# Patient Record
Sex: Female | Born: 1975 | Race: Black or African American | Hispanic: No | Marital: Single | Smoking: Never smoker
Health system: Southern US, Community
[De-identification: ages and names within clinical notes are randomized; demographics above are authoritative.]

## PROBLEM LIST (undated history)

## (undated) HISTORY — PX: HERNIA REPAIR: SHX51

---

## 2012-08-27 ENCOUNTER — Emergency Department: Payer: Self-pay | Admitting: Emergency Medicine

## 2012-08-27 LAB — CBC
HCT: 23.2 % — ABNORMAL LOW (ref 35.0–47.0)
MCH: 14.9 pg — ABNORMAL LOW (ref 26.0–34.0)
Platelet: 157 10*3/uL (ref 150–440)
RBC: 4.27 10*6/uL (ref 3.80–5.20)
RDW: 23.5 % — ABNORMAL HIGH (ref 11.5–14.5)

## 2012-08-27 LAB — COMPREHENSIVE METABOLIC PANEL
Albumin: 3.8 g/dL (ref 3.4–5.0)
Anion Gap: 7 (ref 7–16)
BUN: 13 mg/dL (ref 7–18)
Bilirubin,Total: 0.3 mg/dL (ref 0.2–1.0)
Calcium, Total: 8.8 mg/dL (ref 8.5–10.1)
Chloride: 110 mmol/L — ABNORMAL HIGH (ref 98–107)
Co2: 23 mmol/L (ref 21–32)
Potassium: 3.7 mmol/L (ref 3.5–5.1)
Sodium: 140 mmol/L (ref 136–145)
Total Protein: 8 g/dL (ref 6.4–8.2)

## 2013-09-11 ENCOUNTER — Observation Stay: Payer: Self-pay

## 2014-04-07 ENCOUNTER — Emergency Department: Payer: Self-pay | Admitting: Emergency Medicine

## 2014-06-19 ENCOUNTER — Emergency Department: Payer: Self-pay | Admitting: Emergency Medicine

## 2014-06-22 LAB — BETA STREP CULTURE(ARMC)

## 2014-07-11 ENCOUNTER — Emergency Department: Payer: Self-pay | Admitting: Emergency Medicine
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
## 2019-08-18 ENCOUNTER — Ambulatory Visit: Payer: Self-pay | Attending: Internal Medicine

## 2019-08-18 DIAGNOSIS — Z23 Encounter for immunization: Secondary | ICD-10-CM

## 2019-08-18 NOTE — Progress Notes (Signed)
   Covid-19 Vaccination Clinic  Name:  SUSANNAH CARBIN    MRN: [id number] DOB: [birth year]  08/18/2019  Ms. Glick was observed post Covid-19 immunization for 15 minutes without incident. She was provided with Vaccine Information Sheet and instruction to access the V-Safe system.   Ms. Littleton was instructed to call 911 with any severe reactions post vaccine: Marland Kitchen Difficulty breathing  . Swelling of face and throat  . A fast heartbeat  . A bad rash all over body  . Dizziness and weakness   Immunizations Administered    Name Date Dose VIS Date Route   Moderna COVID-19 Vaccine 08/18/2019  2:15 PM 0.5 mL 04/25/2019 Intramuscular   Manufacturer: Moderna   Lot: [id number]   NDC: 03474-259-56

## 2019-09-15 ENCOUNTER — Ambulatory Visit: Payer: Self-pay | Attending: Internal Medicine

## 2019-09-15 DIAGNOSIS — Z23 Encounter for immunization: Secondary | ICD-10-CM

## 2019-09-15 NOTE — Progress Notes (Signed)
   Covid-19 Vaccination Clinic  Name:  Diane Burton    MRN: [id number] DOB: 1975-07-17  09/15/2019  [name] was observed post Covid-19 immunization for 15 minutes without incident. She was provided with Vaccine Information Sheet and instruction to access the V-Safe system.   [name] was instructed to call 911 with any severe reactions post vaccine: Marland Kitchen Difficulty breathing  . Swelling of face and throat  . A fast heartbeat  . A bad rash all over body  . Dizziness and weakness   Immunizations Administered    Name Date Dose VIS Date Route   Moderna COVID-19 Vaccine 09/15/2019  2:04 PM 0.5 mL 04/2019 Intramuscular   Manufacturer: Moderna   Lot: [id number]   NDC: 29047-533-91

## 2020-03-22 ENCOUNTER — Other Ambulatory Visit: Payer: Self-pay

## 2020-03-22 ENCOUNTER — Encounter: Payer: Self-pay | Admitting: Emergency Medicine

## 2020-03-22 ENCOUNTER — Emergency Department: Payer: BC Managed Care – PPO

## 2020-03-22 ENCOUNTER — Emergency Department
Admission: EM | Admit: 2020-03-22 | Discharge: 2020-03-22 | Disposition: A | Discharge status: Home / Self Care | Payer: BC Managed Care – PPO | Attending: Emergency Medicine | Admitting: Emergency Medicine

## 2020-03-22 DIAGNOSIS — N839 Noninflammatory disorder of ovary, fallopian tube and broad ligament, unspecified: Secondary | ICD-10-CM | POA: Diagnosis not present

## 2020-03-22 DIAGNOSIS — M542 Cervicalgia: Secondary | ICD-10-CM | POA: Diagnosis present

## 2020-03-22 DIAGNOSIS — Y92410 Unspecified street and highway as the place of occurrence of the external cause: Secondary | ICD-10-CM | POA: Diagnosis not present

## 2020-03-22 DIAGNOSIS — M549 Dorsalgia, unspecified: Secondary | ICD-10-CM | POA: Insufficient documentation

## 2020-03-22 DIAGNOSIS — N838 Other noninflammatory disorders of ovary, fallopian tube and broad ligament: Secondary | ICD-10-CM

## 2020-03-22 DIAGNOSIS — R079 Chest pain, unspecified: Secondary | ICD-10-CM | POA: Insufficient documentation

## 2020-03-22 LAB — CBC
HCT: 36 % (ref 36.0–46.0)
Hemoglobin: 10.9 g/dL — ABNORMAL LOW (ref 12.0–15.0)
MCH: 19.7 pg — ABNORMAL LOW (ref 26.0–34.0)
MCHC: 30.3 g/dL (ref 30.0–36.0)
MCV: 65 fL — ABNORMAL LOW (ref 80.0–100.0)
Platelets: 252 10*3/uL (ref 150–400)
RBC: 5.54 MIL/uL — ABNORMAL HIGH (ref 3.87–5.11)
RDW: 19.8 % — ABNORMAL HIGH (ref 11.5–15.5)
WBC: 8.5 10*3/uL (ref 4.0–10.5)
nRBC: 0 % (ref 0.0–0.2)

## 2020-03-22 LAB — BASIC METABOLIC PANEL
Anion gap: 8 (ref 5–15)
BUN: 12 mg/dL (ref 6–20)
CO2: 23 mmol/L (ref 22–32)
Calcium: 9.1 mg/dL (ref 8.9–10.3)
Chloride: 104 mmol/L (ref 98–111)
Creatinine, Ser: 0.71 mg/dL (ref 0.44–1.00)
GFR, Estimated: >60 mL/min (ref >60)
Glucose, Bld: 93 mg/dL (ref 70–99)
Potassium: 3.5 mmol/L (ref 3.5–5.1)
Sodium: 135 mmol/L (ref 135–145)

## 2020-03-22 LAB — TROPONIN I (HIGH SENSITIVITY): Troponin I (High Sensitivity): 3 ng/L (ref <18)

## 2020-03-22 MED ORDER — BACLOFEN 5 MG PO TABS: 5.0000 mg | ORAL_TABLET | Freq: Three times a day (TID) | ORAL | 0 refills | Status: AC | PRN

## 2020-03-22 MED ORDER — IBUPROFEN 400 MG PO TABS
400.0000 mg | ORAL_TABLET | Freq: Once | ORAL | Status: AC | PRN
  Administered 2020-03-22: 400 mg via ORAL
  Filled 2020-03-22: qty 1

## 2020-03-22 MED ORDER — IOHEXOL 300 MG/ML  SOLN
100.0000 mL | Freq: Once | INTRAMUSCULAR | Status: AC | PRN
  Administered 2020-03-22: 100 mL via INTRAVENOUS
  Filled 2020-03-22: qty 100
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
